# Patient Record
Sex: Female | Born: 1985 | Race: White | Hispanic: No | Marital: Single | Smoking: Current every day smoker
Health system: Southern US, Community
[De-identification: ages and names within clinical notes are randomized; demographics above are authoritative.]

## PROBLEM LIST (undated history)

## (undated) DIAGNOSIS — N200 Calculus of kidney: Secondary | ICD-10-CM

## (undated) DIAGNOSIS — F329 Major depressive disorder, single episode, unspecified: Secondary | ICD-10-CM

## (undated) DIAGNOSIS — F32A Depression, unspecified: Secondary | ICD-10-CM

## (undated) HISTORY — PX: BARTHOLIN GLAND CYST EXCISION: SHX565

## (undated) HISTORY — DX: Depression, unspecified: F32.A

## (undated) HISTORY — DX: Major depressive disorder, single episode, unspecified: F32.9

## (undated) HISTORY — DX: Calculus of kidney: N20.0

---

## 2016-11-27 ENCOUNTER — Emergency Department (HOSPITAL_COMMUNITY)
Admission: EM | Admit: 2016-11-27 | Discharge: 2016-11-27 | Disposition: A | Discharge status: Home / Self Care | Payer: Medicaid Other | Attending: Emergency Medicine | Admitting: Emergency Medicine

## 2016-11-27 ENCOUNTER — Emergency Department (HOSPITAL_COMMUNITY): Payer: Medicaid Other

## 2016-11-27 ENCOUNTER — Encounter (HOSPITAL_COMMUNITY): Payer: Self-pay | Admitting: Emergency Medicine

## 2016-11-27 DIAGNOSIS — L03116 Cellulitis of left lower limb: Secondary | ICD-10-CM | POA: Diagnosis not present

## 2016-11-27 DIAGNOSIS — L02612 Cutaneous abscess of left foot: Secondary | ICD-10-CM | POA: Diagnosis not present

## 2016-11-27 DIAGNOSIS — Z87891 Personal history of nicotine dependence: Secondary | ICD-10-CM | POA: Diagnosis not present

## 2016-11-27 DIAGNOSIS — M79672 Pain in left foot: Secondary | ICD-10-CM | POA: Diagnosis present

## 2016-11-27 DIAGNOSIS — L02619 Cutaneous abscess of unspecified foot: Secondary | ICD-10-CM

## 2016-11-27 DIAGNOSIS — L03119 Cellulitis of unspecified part of limb: Secondary | ICD-10-CM

## 2016-11-27 LAB — CBC WITH DIFFERENTIAL/PLATELET
BASOS ABS: 0 10*3/uL (ref 0.0–0.1)
BASOS PCT: 0 %
EOS ABS: 0.1 10*3/uL (ref 0.0–0.7)
EOS PCT: 1 %
HCT: 43.5 % (ref 36.0–46.0)
Hemoglobin: 14.6 g/dL (ref 12.0–15.0)
LYMPHS PCT: 26 %
Lymphs Abs: 2.6 10*3/uL (ref 0.7–4.0)
MCH: 29.6 pg (ref 26.0–34.0)
MCHC: 33.6 g/dL (ref 30.0–36.0)
MCV: 88.1 fL (ref 78.0–100.0)
Monocytes Absolute: 0.9 10*3/uL (ref 0.1–1.0)
Monocytes Relative: 9 %
Neutro Abs: 6.4 10*3/uL (ref 1.7–7.7)
Neutrophils Relative %: 64 %
PLATELETS: 173 10*3/uL (ref 150–400)
RBC: 4.94 MIL/uL (ref 3.87–5.11)
RDW: 12.6 % (ref 11.5–15.5)
WBC: 10 10*3/uL (ref 4.0–10.5)

## 2016-11-27 LAB — BASIC METABOLIC PANEL
ANION GAP: 7 (ref 5–15)
BUN: 15 mg/dL (ref 6–20)
CO2: 25 mmol/L (ref 22–32)
Calcium: 8.9 mg/dL (ref 8.9–10.3)
Chloride: 104 mmol/L (ref 101–111)
Creatinine, Ser: 0.66 mg/dL (ref 0.44–1.00)
Glucose, Bld: 95 mg/dL (ref 65–99)
POTASSIUM: 3.8 mmol/L (ref 3.5–5.1)
SODIUM: 136 mmol/L (ref 135–145)

## 2016-11-27 MED ORDER — KETOROLAC TROMETHAMINE 30 MG/ML IJ SOLN
30.0000 mg | Freq: Once | INTRAMUSCULAR | Status: AC
  Administered 2016-11-27: 30 mg via INTRAVENOUS
  Filled 2016-11-27: qty 1

## 2016-11-27 MED ORDER — SULFAMETHOXAZOLE-TRIMETHOPRIM 800-160 MG PO TABS: 1.0000 | ORAL_TABLET | Freq: Two times a day (BID) | ORAL | 0 refills | Status: AC

## 2016-11-27 MED ORDER — LIDOCAINE-EPINEPHRINE-TETRACAINE (LET) SOLUTION
3.0000 mL | Freq: Once | NASAL | Status: AC
  Administered 2016-11-27: 3 mL via TOPICAL
  Filled 2016-11-27: qty 3

## 2016-11-27 MED ORDER — HYDROCODONE-ACETAMINOPHEN 5-325 MG PO TABS: ORAL_TABLET | ORAL | 0 refills | Status: DC

## 2016-11-27 MED ORDER — SODIUM CHLORIDE 0.9 % IV SOLN
3.0000 g | Freq: Four times a day (QID) | INTRAVENOUS | Status: DC
  Administered 2016-11-27: 3 g via INTRAVENOUS
  Filled 2016-11-27 (×2): qty 3

## 2016-11-27 MED ORDER — IBUPROFEN 600 MG PO TABS: 600.0000 mg | ORAL_TABLET | Freq: Four times a day (QID) | ORAL | 0 refills | Status: DC | PRN

## 2016-11-27 NOTE — ED Triage Notes (Signed)
Patient states she went to a foot spa and the next day noticed a "brown spot" on the bottom of her left foot 5 days ago. States now she has pain and redness noted to side of left foot.

## 2016-11-27 NOTE — Discharge Instructions (Signed)
Elevate your foot as much as possible.  Warm water soaks 2-3 times a day.  Keep your foot bandaged and wear shoes and socks.  Return here in 2 days for recheck or sooner if the redness and streaking are getting worse.

## 2016-12-01 NOTE — ED Provider Notes (Signed)
[facility] DEPT Provider Note   CSN: [id number] Arrival date & time: 11/27/16  1435     History   Chief Complaint Chief Complaint  Patient presents with  . Foot Pain    HPI Sandra Soto is a 31 y.o. female.  HPI   Sandra Soto is a 31 y.o. female who presents to the Emergency Department complaining of a painful sore to the bottom of her left foot.  States that she used a at home "foot spa" and noticed the painful area the following day.  Noticed a red streak from the bottom of her foot that radiates to her ankle.  Describes pain with weight bearing.  States she has tried to squeeze the sore and prick it with a pin.  She denies fever, known foreign body, numbness of the foot.    History reviewed. No pertinent past medical history.  There are no active problems to display for this patient.   Past Surgical History:  Procedure Laterality Date  . BARTHOLIN GLAND CYST EXCISION      OB History    No data available       Home Medications    Prior to Admission medications   Medication Sig Start Date End Date Taking? Authorizing Provider  HYDROcodone-acetaminophen (NORCO/VICODIN) 5-325 MG tablet Take one tab po q 4 hrs prn pain 11/27/16   Amear Strojny, PA-C  ibuprofen (ADVIL,MOTRIN) 600 MG tablet Take 1 tablet (600 mg total) by mouth every 6 (six) hours as needed. 11/27/16   Jett Fukuda, PA-C  sulfamethoxazole-trimethoprim (BACTRIM DS,SEPTRA DS) 800-160 MG tablet Take 1 tablet by mouth 2 (two) times daily. 11/27/16 12/04/16  Pauline Aus, PA-C    Family History History reviewed. No pertinent family history.  Social History Social History  Substance Use Topics  . Smoking status: Current Every Day Smoker  . Smokeless tobacco: Never Used  . Alcohol use No     Allergies   Patient has no known allergies.   Review of Systems Review of Systems  Constitutional: Negative for chills and fever.  Cardiovascular: Negative for chest pain.  Gastrointestinal:  Negative for nausea and vomiting.  Musculoskeletal: Positive for arthralgias. Negative for joint swelling.  Skin: Positive for color change and wound.       Pain, redness to the left foot.  All other systems reviewed and are negative.    Physical Exam Updated Vital Signs BP 112/77 (BP Location: Right Arm)   Pulse (!) 56   Temp 98.1 F (36.7 C) (Oral)   Resp 18   Ht 5\' 2"  (1.575 m)   Wt 48.5 kg (107 lb)   LMP 11/09/2016   SpO2 100%   BMI 19.57 kg/m   Physical Exam  Constitutional: She is oriented to person, place, and time. She appears well-developed and well-nourished. No distress.  HENT:  Head: Normocephalic and atraumatic.  Cardiovascular: Normal rate, regular rhythm, normal heart sounds and intact distal pulses.   Pulmonary/Chest: Effort normal and breath sounds normal.  Musculoskeletal: She exhibits tenderness. She exhibits no edema.  Single, small pustule to the plantar surface of the left foot with mild surrounding erythema and single red streak from the plantar foot extending to the level of the ankle.  No edema.  No calf pain or tenderness.  Neurological: She is alert and oriented to person, place, and time. No sensory deficit. She exhibits normal muscle tone. Coordination normal.  Skin: Skin is warm and dry. Capillary refill takes less than 2 seconds.  Nursing note  and vitals reviewed.    ED Treatments / Results  Labs (all labs ordered are listed, but only abnormal results are displayed) Labs Reviewed  CBC WITH DIFFERENTIAL/PLATELET  BASIC METABOLIC PANEL  POC URINE PREG, ED    EKG  EKG Interpretation None       Radiology No results found.  Procedures Procedures (including critical care time)  Medications Ordered in ED Medications  ketorolac (TORADOL) 30 MG/ML injection 30 mg (30 mg Intravenous Given 11/27/16 1807)  lidocaine-EPINEPHrine-tetracaine (LET) solution (3 mLs Topical Given 11/27/16 1906)     Initial Impression / Assessment and Plan /  ED Course  I have reviewed the triage vital signs and the nursing notes.  Pertinent labs & imaging results that were available during my care of the patient were reviewed by me and considered in my medical decision making (see chart for details).     Pt with cellulitis of the left foot with lymphangitis.  IV Unasyn given here.  NV intact.  Pt well appearing, non-toxic.    Procedure: Foot cleaned with saline and betadine.  Pustule to the plantar foot was deroofed using an 18 g needle.  Small amt of purulent material drained.  No FB.  Area explored.    Foot bandaged.  Crutches given.  Pt agrees to oral abx and recheck in 1-2 days, warm soaks.     Final Clinical Impressions(s) / ED Diagnoses   Final diagnoses:  Cellulitis and abscess of foot    New Prescriptions Discharge Medication List as of 11/27/2016  7:42 PM    START taking these medications   Details  HYDROcodone-acetaminophen (NORCO/VICODIN) 5-325 MG tablet Take one tab po q 4 hrs prn pain, Print    ibuprofen (ADVIL,MOTRIN) 600 MG tablet Take 1 tablet (600 mg total) by mouth every 6 (six) hours as needed., Starting Tue 11/27/2016, Print    sulfamethoxazole-trimethoprim (BACTRIM DS,SEPTRA DS) 800-160 MG tablet Take 1 tablet by mouth 2 (two) times daily., Starting Tue 11/27/2016, Until Tue 12/04/2016, Print         Estill Springsriplett, [place] Ranch[place] Estateammy, PA-C 12/01/16 1803    Mancel BaleWentz, Elliott, MD 12/01/16 2328

## 2017-07-19 ENCOUNTER — Encounter: Payer: Self-pay | Admitting: *Deleted

## 2017-07-19 ENCOUNTER — Encounter: Payer: Self-pay | Admitting: Cardiovascular Disease

## 2017-07-22 ENCOUNTER — Encounter: Payer: Self-pay | Admitting: Cardiovascular Disease

## 2017-07-22 ENCOUNTER — Ambulatory Visit (INDEPENDENT_AMBULATORY_CARE_PROVIDER_SITE_OTHER): Payer: Medicaid Other | Admitting: Cardiovascular Disease

## 2017-07-22 VITALS — BP 130/70 | HR 90 | Ht 62.0 in | Wt 112.0 lb

## 2017-07-22 DIAGNOSIS — Z9289 Personal history of other medical treatment: Secondary | ICD-10-CM

## 2017-07-22 DIAGNOSIS — R079 Chest pain, unspecified: Secondary | ICD-10-CM

## 2017-07-22 DIAGNOSIS — R55 Syncope and collapse: Secondary | ICD-10-CM

## 2017-07-22 DIAGNOSIS — R0602 Shortness of breath: Secondary | ICD-10-CM | POA: Diagnosis not present

## 2017-07-22 DIAGNOSIS — R0601 Orthopnea: Secondary | ICD-10-CM

## 2017-07-22 DIAGNOSIS — R002 Palpitations: Secondary | ICD-10-CM | POA: Diagnosis not present

## 2017-07-22 NOTE — Progress Notes (Signed)
CARDIOLOGY CONSULT NOTE  Patient ID: Sandra Soto MRN: [id number] DOB/AGE: 20-May-1985 32 y.o.  Admit date: (Not on file) Primary Physician: [facility] Referring Physician: Dr. Su Monks  Reason for Consultation: syncope  HPI: Sandra Soto is a 32 y.o. female who is being seen today for the evaluation of syncope at the request of Ah, Molly Maduro, MD.   She was recently hospitalized at [facility].  I reviewed all relevant documentation, labs, and studies.  As per the emergency department report, she been having intercourse and afterwards had to lay on the floor for 5 hours due to lower abdominal pain and lower back pain that led to nausea and vomiting.  She apparently had a normal gynecologic exam.  A CT of the abdomen and pelvis showed a right adnexal probable corpus luteum cyst and right adnexal fluid.  There was no renal mass or urinary tract obstruction.  Relevant labs: BUN 15, creatinine 0.63, sodium 140, potassium 3.6, normal troponin, normal TSH, white blood cell 7.6, hemoglobin 14.  Urine pregnancy test was negative.  Urine toxicology screen was positive for cannabinoids.  Urinalysis showed moderate blood and trace leukocyte esterase.  I reviewed an ECG which demonstrated a short PR interval and what was described as an ectopic atrial rhythm.  There were nonspecific ST segment abnormalities in inferolateral leads.  Upon speaking with her, the history is somewhat different.  She said she had driven to school to pick up her daughters and then was driving home and began to feel very weak and felt numbness and tingling and weakness of her arms and legs.  She walked in the house and passed out on the bed.  She thinks she may have lost consciousness for close to 20 minutes.  But the time EMS came, she could not open her eyes but could hear people's voices.  She said she was given IV fluids at the hospital.  She has been experiencing right sided chest pains for the  past 3 months which can occur both with and without exertion.  It is aggravated while lying down.  She also feels short of breath while lying down but denies paroxysmal nocturnal dyspnea.  She also denies leg swelling.  She has been having palpitations more recently.  She denies any prior episodes of syncope.  She denies a family history of sudden cardiac death.  No Known Allergies  No current outpatient medications on file.   No current facility-administered medications for this visit.     Past Medical History:  Diagnosis Date  . Depression   . Kidney stones     Past Surgical History:  Procedure Laterality Date  . BARTHOLIN GLAND CYST EXCISION      Social History   Socioeconomic History  . Marital status: Single    Spouse name: Not on file  . Number of children: Not on file  . Years of education: Not on file  . Highest education level: Not on file  Social Needs  . Financial resource strain: Not on file  . Food insecurity - worry: Not on file  . Food insecurity - inability: Not on file  . Transportation needs - medical: Not on file  . Transportation needs - non-medical: Not on file  Occupational History  . Not on file  Tobacco Use  . Smoking status: Current Every Day Smoker  . Smokeless tobacco: Never Used  Substance and Sexual Activity  . Alcohol use: No  . Drug  use: No  . Sexual activity: Not on file  Other Topics Concern  . Not on file  Social History Narrative  . Not on file     No family history of premature CAD in 1st degree relatives.  No outpatient medications have been marked as taking for the 07/22/17 encounter (Office Visit) with Laqueta LindenKoneswaran, Suresh A, MD.      Review of systems complete and found to be negative unless listed above in HPI    Physical exam Blood pressure 130/70, pulse 90, height 5\' 2"  (1.575 m), weight 112 lb (50.8 kg), SpO2 98 %. General: NAD Neck: No JVD, no thyromegaly or thyroid nodule.  Lungs: Clear to auscultation  bilaterally with normal respiratory effort. CV: Nondisplaced PMI. Regular rate and rhythm, normal S1/S2, no S3/S4, no murmur.  No peripheral edema.  No carotid bruit.    Abdomen: Soft, nontender, no distention.  Skin: Intact without lesions or rashes.  Neurologic: Alert and oriented x 3.  Psych: Normal affect. Extremities: No clubbing or cyanosis.  HEENT: Normal.   ECG: Most recent ECG reviewed.   Labs: Lab Results  Component Value Date/Time   K 3.8 11/27/2016 04:54 PM   BUN 15 11/27/2016 04:54 PM   CREATININE 0.66 11/27/2016 04:54 PM   HGB 14.6 11/27/2016 04:54 PM     Lipids: No results found for: LDLCALC, LDLDIRECT, CHOL, TRIG, HDL      ASSESSMENT AND PLAN:  1.  Syncope and palpitations: Unclear etiology at this time.  In order to evaluate for an arrhythmic etiology, I will obtain a 30-day event monitor.  And instructed her not to drive.  2.  Chest pain and shortness of breath: She appears to have symptoms of orthopnea.  There is no evidence of decompensated heart failure on physical exam.  I will obtain an echocardiogram to evaluate cardiac structure and function.  We will also obtain an exercise tolerance test.     Disposition: Follow up in 2 months   Signed: Prentice DockerSuresh Koneswaran, M.D., F.A.C.C.  07/22/2017, 9:27 AM

## 2017-07-22 NOTE — Patient Instructions (Signed)
Medication Instructions:  Your physician recommends that you continue on your current medications as directed. Please refer to the Current Medication list given to you today.  Labwork: NONE  Testing/Procedures: Your physician has requested that you have an exercise tolerance test. For further information please visit [email or web address]. Please also follow instruction sheet, as given.  Your physician has requested that you have an echocardiogram. Echocardiography is a painless test that uses sound waves to create images of your heart. It provides your doctor with information about the size and shape of your heart and how well your heart's chambers and valves are working. This procedure takes approximately one hour. There are no restrictions for this procedure.  Your physician has recommended that you wear an event monitor FOR 30 DAYS. Event monitors are medical devices that record the heart's electrical activity. Doctors most often us these monitors to diagnose arrhythmias. Arrhythmias are problems with the speed or rhythm of the heartbeat. The monitor is a small, portable device. You can wear one while you do your normal daily activities. This is usually used to diagnose what is causing palpitations/syncope (passing out).   Follow-Up: Your physician recommends that you schedule a follow-up appointment in: 2 MONTHS WITH DR. Purvis SheffieldKONESWARAN   Any Other Special Instructions Will Be Listed Below (If Applicable).  If you need a refill on your cardiac medications before your next appointment, please call your pharmacy.

## 2017-07-24 ENCOUNTER — Ambulatory Visit (INDEPENDENT_AMBULATORY_CARE_PROVIDER_SITE_OTHER): Payer: Medicaid Other

## 2017-07-24 DIAGNOSIS — R55 Syncope and collapse: Secondary | ICD-10-CM

## 2017-07-24 DIAGNOSIS — R079 Chest pain, unspecified: Secondary | ICD-10-CM | POA: Diagnosis not present

## 2017-07-24 DIAGNOSIS — R002 Palpitations: Secondary | ICD-10-CM | POA: Diagnosis not present

## 2017-07-29 ENCOUNTER — Ambulatory Visit (HOSPITAL_COMMUNITY)
Admission: RE | Admit: 2017-07-29 | Discharge: 2017-07-29 | Disposition: A | Discharge status: Home / Self Care | Payer: Medicaid Other | Source: Ambulatory Visit | Attending: Cardiovascular Disease | Admitting: Cardiovascular Disease

## 2017-07-29 DIAGNOSIS — R079 Chest pain, unspecified: Secondary | ICD-10-CM | POA: Insufficient documentation

## 2017-07-29 DIAGNOSIS — R002 Palpitations: Secondary | ICD-10-CM

## 2017-07-29 DIAGNOSIS — R55 Syncope and collapse: Secondary | ICD-10-CM

## 2017-07-29 LAB — EXERCISE TOLERANCE TEST
CSEPED: 13 min
CSEPHR: 91 %
Estimated workload: 17.2 METS
Exercise duration (sec): 6 s
MPHR: 187 {beats}/min
Peak HR: 171 {beats}/min
RPE: 15
Rest HR: 69 {beats}/min

## 2017-07-29 NOTE — Progress Notes (Signed)
*  PRELIMINARY RESULTS* Echocardiogram 2D Echocardiogram has been performed.  Stacey DrainWhite, Naomi Castrogiovanni J 07/29/2017, 12:12 PM

## 2017-07-30 ENCOUNTER — Telehealth: Payer: Self-pay | Admitting: *Deleted

## 2017-07-30 DIAGNOSIS — I498 Other specified cardiac arrhythmias: Secondary | ICD-10-CM

## 2017-07-30 NOTE — Telephone Encounter (Signed)
-----   Message from Laqueta LindenSuresh A Koneswaran, MD sent at 07/30/2017 10:23 AM EDT ----- Please refer to EP.

## 2017-07-30 NOTE — Telephone Encounter (Signed)
-----   Message from Suresh A Koneswaran, MD sent at 07/30/2017 10:23 AM EDT ----- Please refer to EP. 

## 2017-07-30 NOTE — Telephone Encounter (Signed)
Called patient with test results. No answer. Left message to call back.  

## 2017-08-01 NOTE — Telephone Encounter (Signed)
Pt called back for test results 

## 2017-08-01 NOTE — Telephone Encounter (Signed)
Pt notified of echo results

## 2017-09-04 ENCOUNTER — Telehealth: Payer: Self-pay

## 2017-09-04 NOTE — Telephone Encounter (Signed)
-----   Message from Laqueta LindenSuresh A Koneswaran, MD sent at 09/03/2017  7:31 PM EDT ----- Essentially normal.

## 2017-09-04 NOTE — Telephone Encounter (Signed)
Patient notified. Routed to PCP 

## 2017-09-05 ENCOUNTER — Institutional Professional Consult (permissible substitution): Payer: Medicaid Other | Admitting: Internal Medicine

## 2017-09-26 ENCOUNTER — Encounter: Payer: Self-pay | Admitting: Cardiovascular Disease

## 2017-09-26 ENCOUNTER — Ambulatory Visit: Payer: Self-pay | Admitting: Cardiovascular Disease

## 2017-11-07 ENCOUNTER — Institutional Professional Consult (permissible substitution): Payer: Medicaid Other | Admitting: Internal Medicine

## 2017-12-03 ENCOUNTER — Institutional Professional Consult (permissible substitution): Payer: Medicaid Other | Admitting: Internal Medicine

## 2017-12-04 ENCOUNTER — Encounter: Payer: Self-pay | Admitting: Internal Medicine

## 2018-07-05 IMAGING — DX DG FOOT COMPLETE 3+V*L*
3 series · 3 of 3 positions shown · non-contrast
Comparison: None.

CLINICAL DATA: Brown spot on center of foot for 5 days. Evaluate
for foreign body.

EXAM:
LEFT FOOT - COMPLETE 3+ VIEW

[foot ap]
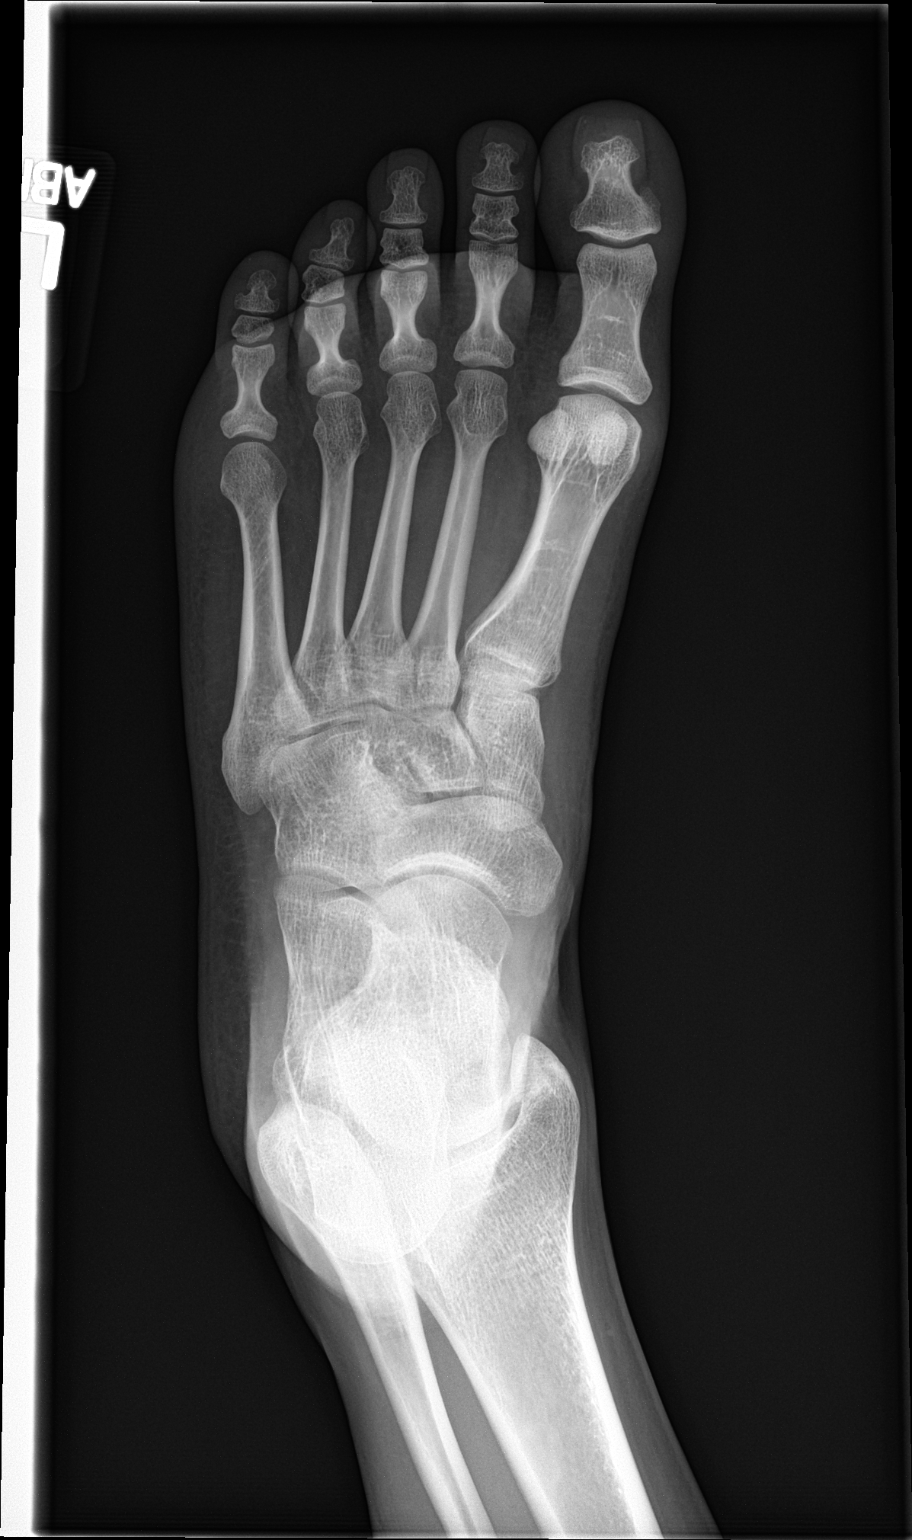

[foot obl]
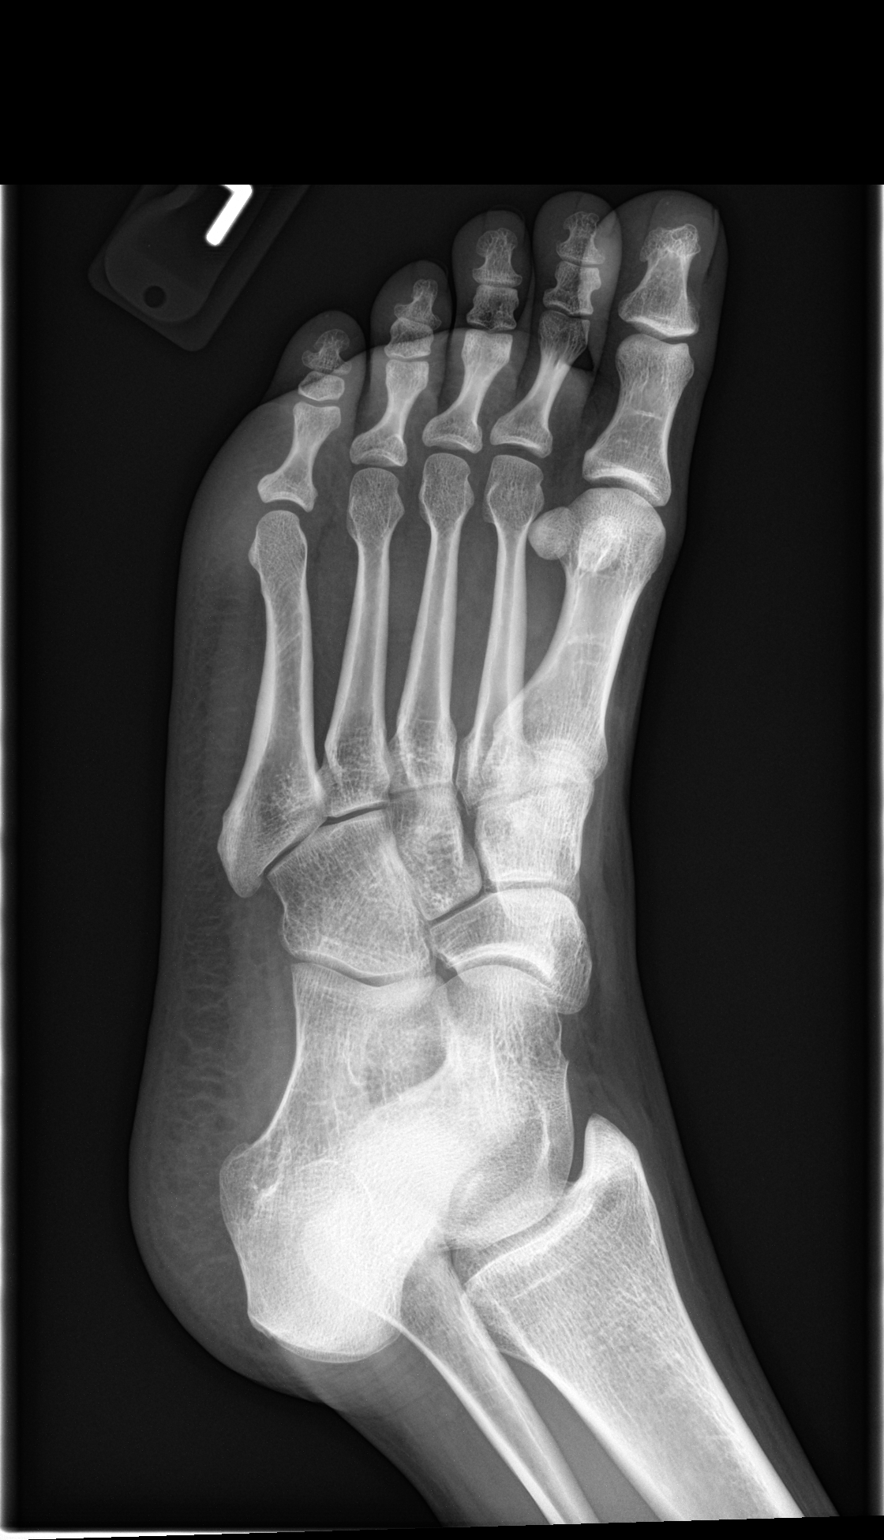

[foot lat]
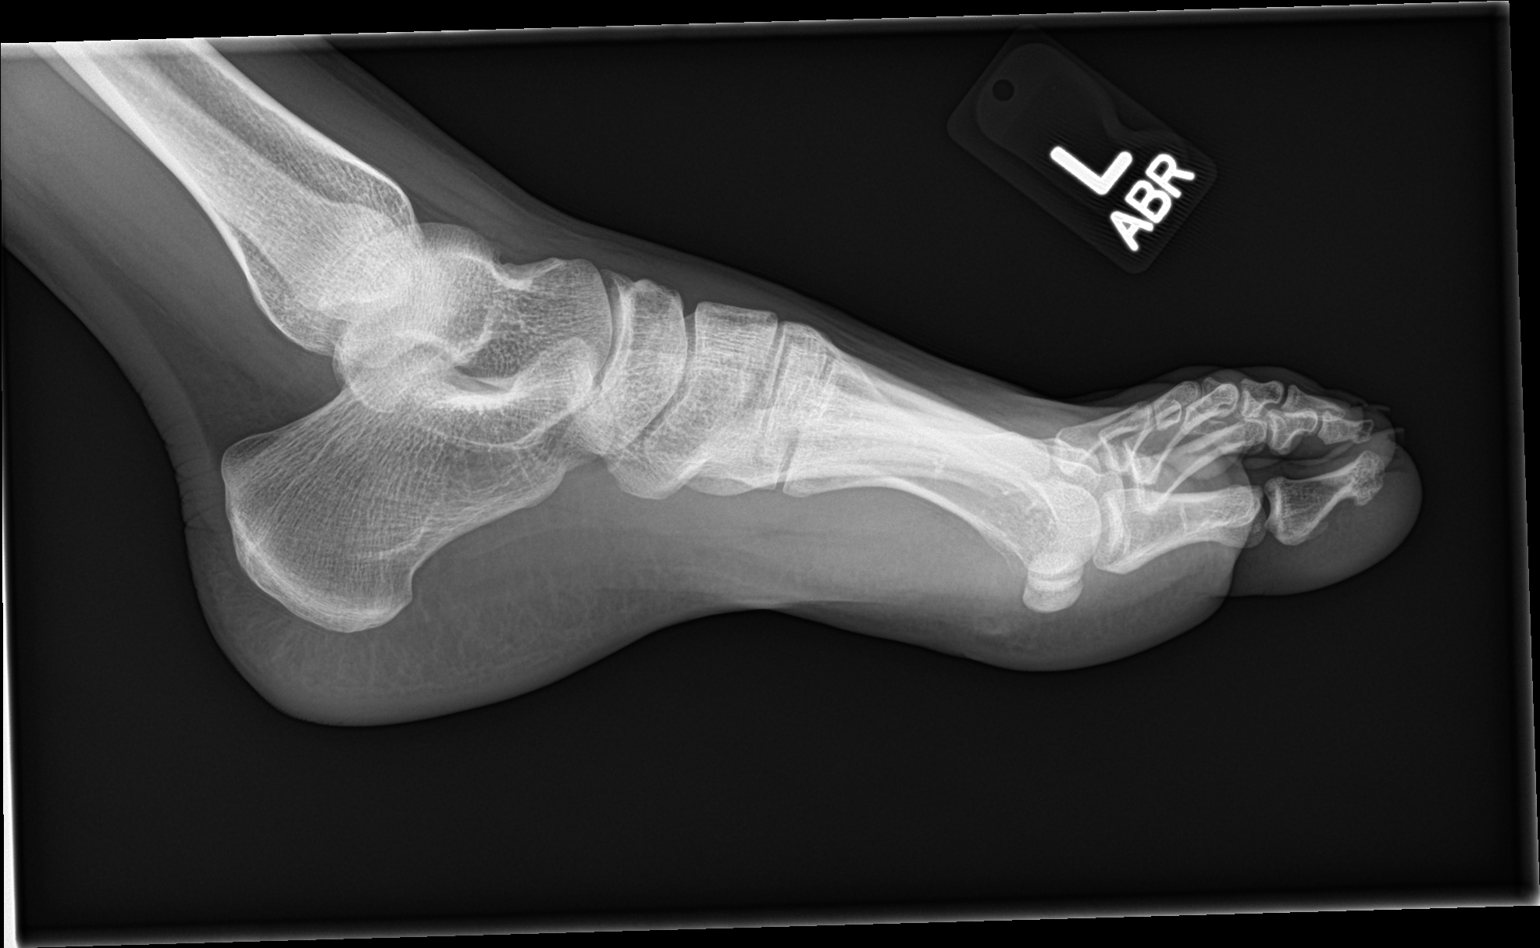

[3 of 3 positions shown; findings below may reference images not displayed]

FINDINGS: No opaque foreign body or suspected soft tissue emphysema. No
fracture deformity or erosion.
IMPRESSION: No opaque foreign body or osseous abnormality.
